# Patient Record
Sex: Female | Born: 1995 | Race: White | Hispanic: No | Marital: Single | Smoking: Never smoker
Health system: Southern US, Community
[De-identification: ages and names within clinical notes are randomized; demographics above are authoritative.]

## PROBLEM LIST (undated history)

## (undated) DIAGNOSIS — F32A Depression, unspecified: Secondary | ICD-10-CM

## (undated) DIAGNOSIS — R51 Headache: Secondary | ICD-10-CM

## (undated) DIAGNOSIS — G4700 Insomnia, unspecified: Secondary | ICD-10-CM

## (undated) DIAGNOSIS — R569 Unspecified convulsions: Secondary | ICD-10-CM

## (undated) DIAGNOSIS — F319 Bipolar disorder, unspecified: Secondary | ICD-10-CM

## (undated) DIAGNOSIS — F909 Attention-deficit hyperactivity disorder, unspecified type: Secondary | ICD-10-CM

## (undated) DIAGNOSIS — G8929 Other chronic pain: Secondary | ICD-10-CM

## (undated) DIAGNOSIS — E8881 Metabolic syndrome: Secondary | ICD-10-CM

## (undated) DIAGNOSIS — F329 Major depressive disorder, single episode, unspecified: Secondary | ICD-10-CM

## (undated) HISTORY — DX: Major depressive disorder, single episode, unspecified: F32.9

## (undated) HISTORY — PX: TONSILLECTOMY: SUR1361

## (undated) HISTORY — DX: Metabolic syndrome: E88.81

## (undated) HISTORY — PX: OTHER SURGICAL HISTORY: SHX169

## (undated) HISTORY — DX: Attention-deficit hyperactivity disorder, unspecified type: F90.9

## (undated) HISTORY — DX: Depression, unspecified: F32.A

## (undated) HISTORY — DX: Metabolic syndrome: E88.810

---
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
## 2013-01-23 ENCOUNTER — Other Ambulatory Visit: Payer: Self-pay

## 2013-01-23 DIAGNOSIS — G4452 New daily persistent headache (NDPH): Secondary | ICD-10-CM

## 2013-01-23 DIAGNOSIS — G43101 Migraine with aura, not intractable, with status migrainosus: Secondary | ICD-10-CM

## 2013-01-23 MED ORDER — RELPAX 40 MG PO TABS: ORAL_TABLET | ORAL | Status: AC

## 2013-01-23 MED ORDER — ELETRIPTAN HYDROBROMIDE 40 MG PO TABS: ORAL_TABLET | ORAL | Status: DC

## 2013-01-23 MED ORDER — TIZANIDINE HCL 4 MG PO TABS: ORAL_TABLET | ORAL | Status: AC

## 2013-01-23 MED ORDER — TOPIRAMATE 25 MG PO CPSP: ORAL_CAPSULE | ORAL | Status: AC

## 2013-01-30 ENCOUNTER — Other Ambulatory Visit: Payer: Self-pay

## 2013-01-30 DIAGNOSIS — G4452 New daily persistent headache (NDPH): Secondary | ICD-10-CM

## 2013-01-30 DIAGNOSIS — G43101 Migraine with aura, not intractable, with status migrainosus: Secondary | ICD-10-CM

## 2014-01-23 IMAGING — CR DG FOOT 2V*L*
2 series · 2 of 2 positions shown · non-contrast
Comparison: None.

CLINICAL DATA: Puncture wound

LEFT FOOT - 2 VIEW

[t foot ap left]
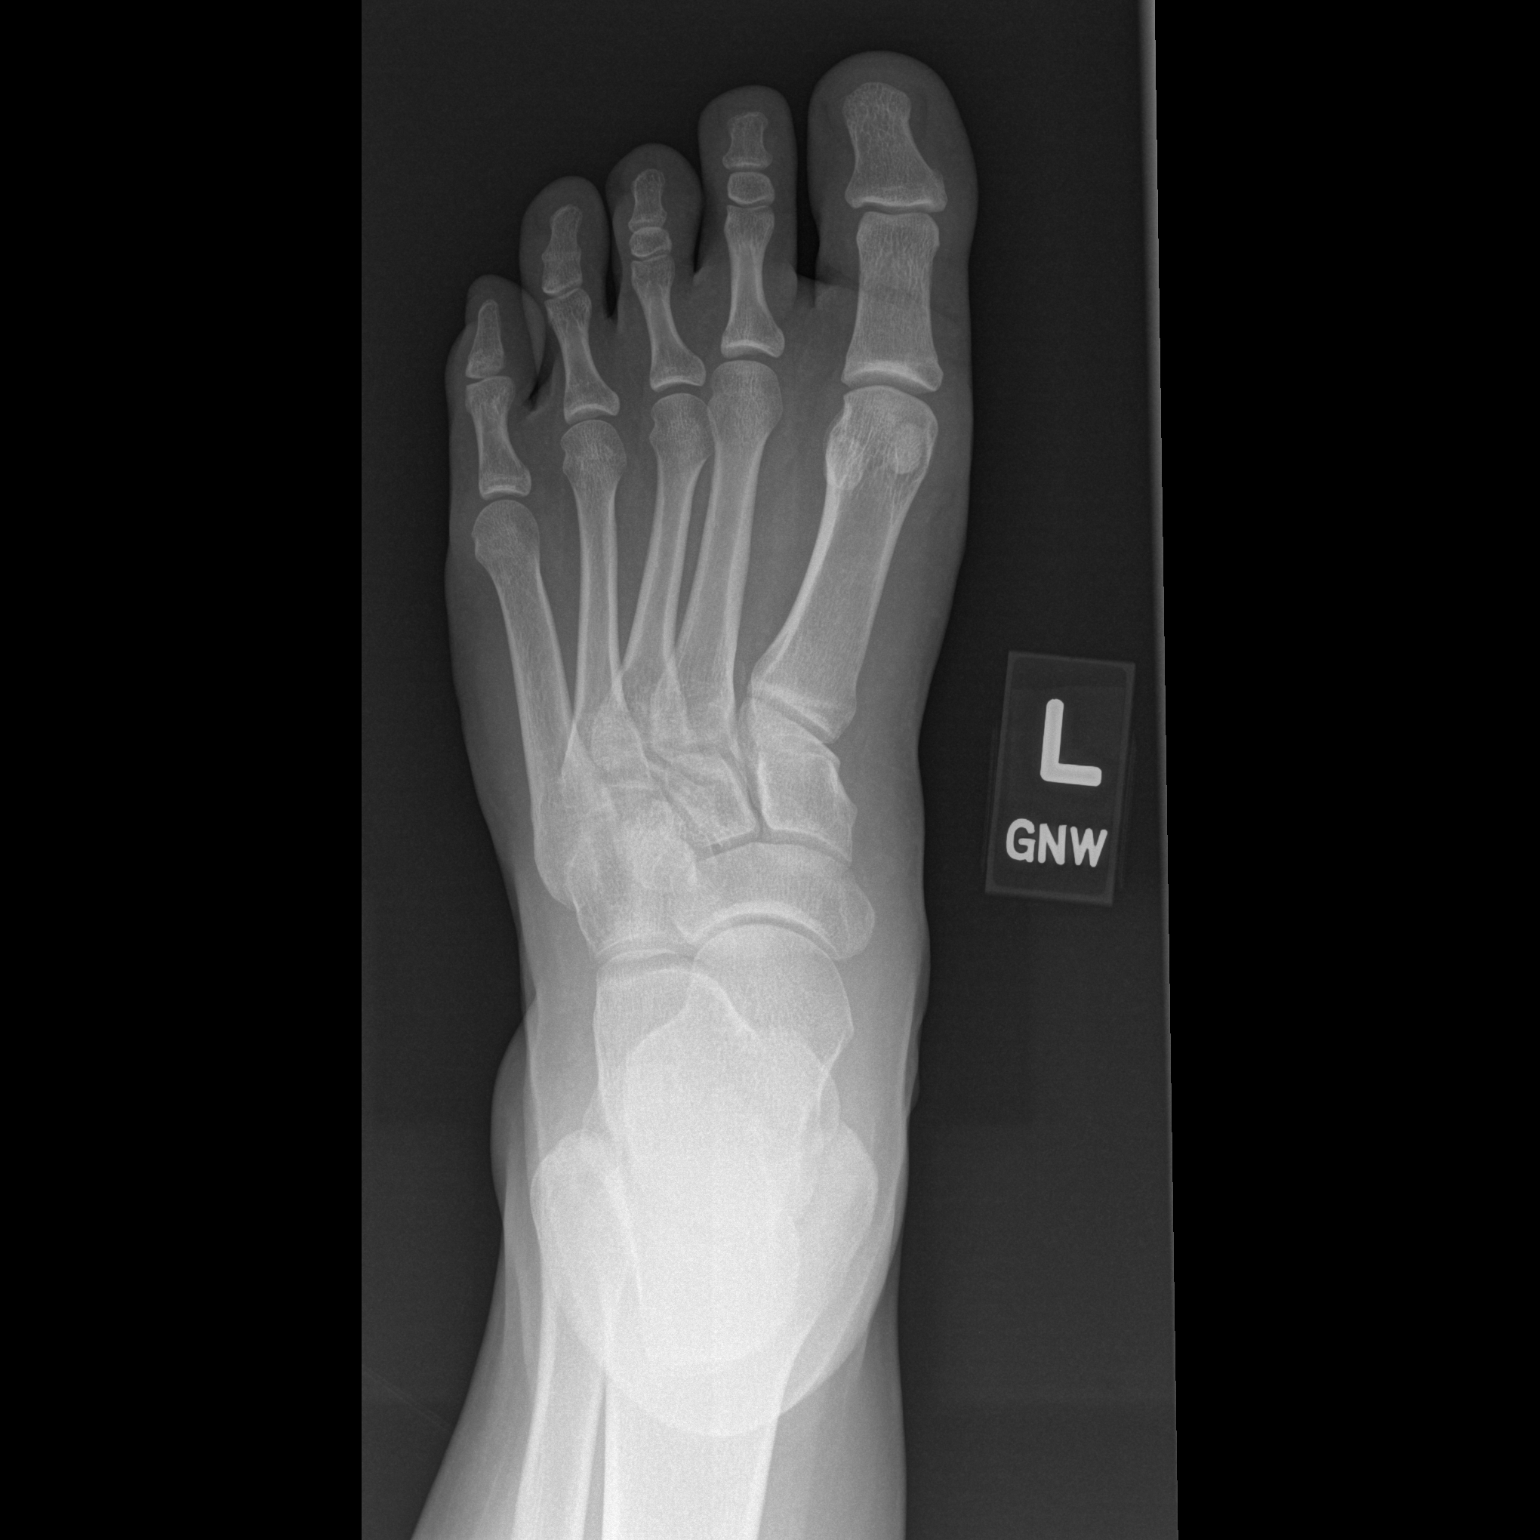

[t foot lat left]
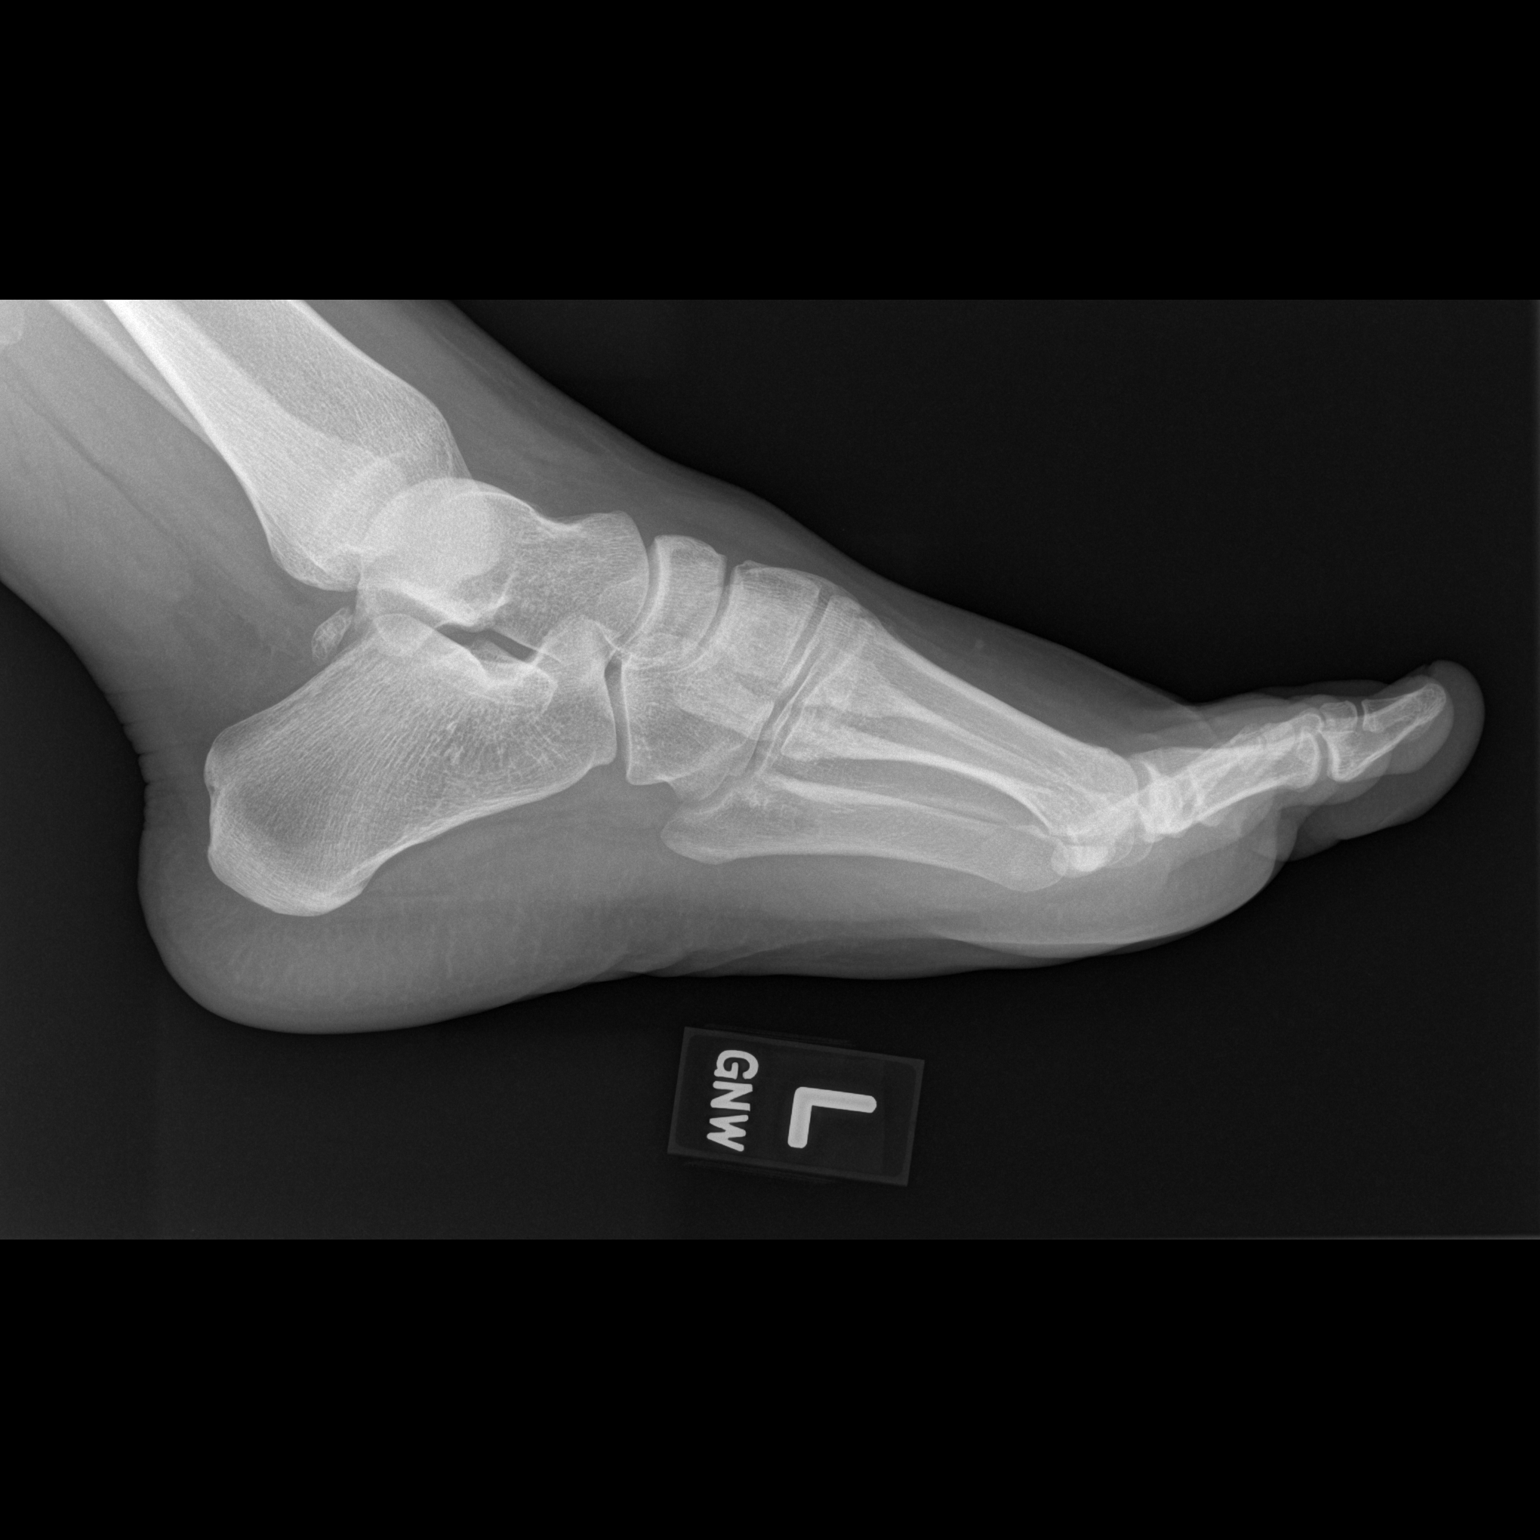

[2 of 2 positions shown; findings below may reference images not displayed]

FINDINGS: No evidence of fracture in the mid foot or forefoot..  No
radiodense foreign body present.
IMPRESSION: No fracture or foreign body.

## 2016-06-25 ENCOUNTER — Emergency Department (HOSPITAL_COMMUNITY)
Admission: EM | Admit: 2016-06-25 | Discharge: 2016-06-25 | Disposition: A | Discharge status: Home / Self Care | Payer: BLUE CROSS/BLUE SHIELD | Attending: Emergency Medicine | Admitting: Emergency Medicine

## 2016-06-25 ENCOUNTER — Encounter (HOSPITAL_COMMUNITY): Payer: Self-pay | Admitting: *Deleted

## 2016-06-25 DIAGNOSIS — Z91010 Allergy to peanuts: Secondary | ICD-10-CM | POA: Insufficient documentation

## 2016-06-25 DIAGNOSIS — F909 Attention-deficit hyperactivity disorder, unspecified type: Secondary | ICD-10-CM | POA: Diagnosis not present

## 2016-06-25 DIAGNOSIS — N390 Urinary tract infection, site not specified: Secondary | ICD-10-CM | POA: Insufficient documentation

## 2016-06-25 DIAGNOSIS — R1030 Lower abdominal pain, unspecified: Secondary | ICD-10-CM | POA: Diagnosis present

## 2016-06-25 LAB — URINE MICROSCOPIC-ADD ON

## 2016-06-25 LAB — CBC
HCT: 38.4 % (ref 36.0–46.0)
HEMOGLOBIN: 12.2 g/dL (ref 12.0–15.0)
MCH: 26.8 pg (ref 26.0–34.0)
MCHC: 31.8 g/dL (ref 30.0–36.0)
MCV: 84.4 fL (ref 78.0–100.0)
Platelets: 341 10*3/uL (ref 150–400)
RBC: 4.55 MIL/uL (ref 3.87–5.11)
RDW: 13.6 % (ref 11.5–15.5)
WBC: 12.8 10*3/uL — AB (ref 4.0–10.5)

## 2016-06-25 LAB — COMPREHENSIVE METABOLIC PANEL
ALK PHOS: 54 U/L (ref 38–126)
ALT: 14 U/L (ref 14–54)
AST: 16 U/L (ref 15–41)
Albumin: 3.4 g/dL — ABNORMAL LOW (ref 3.5–5.0)
Anion gap: 11 (ref 5–15)
BUN: 9 mg/dL (ref 6–20)
CALCIUM: 9 mg/dL (ref 8.9–10.3)
CHLORIDE: 105 mmol/L (ref 101–111)
CO2: 21 mmol/L — AB (ref 22–32)
CREATININE: 0.65 mg/dL (ref 0.44–1.00)
GFR calc non Af Amer: >60 mL/min (ref >60)
GLUCOSE: 109 mg/dL — AB (ref 65–99)
Potassium: 3.7 mmol/L (ref 3.5–5.1)
SODIUM: 137 mmol/L (ref 135–145)
Total Bilirubin: 0.5 mg/dL (ref 0.3–1.2)
Total Protein: 6.7 g/dL (ref 6.5–8.1)

## 2016-06-25 LAB — URINALYSIS, ROUTINE W REFLEX MICROSCOPIC
Bilirubin Urine: NEGATIVE
Glucose, UA: NEGATIVE mg/dL
Ketones, ur: NEGATIVE mg/dL
NITRITE: POSITIVE — AB
Protein, ur: 100 mg/dL — AB
SPECIFIC GRAVITY, URINE: 1.025 (ref 1.005–1.030)
pH: 6 (ref 5.0–8.0)

## 2016-06-25 LAB — LIPASE, BLOOD: Lipase: 23 U/L (ref 11–51)

## 2016-06-25 LAB — I-STAT BETA HCG BLOOD, ED (MC, WL, AP ONLY)

## 2016-06-25 MED ORDER — PHENAZOPYRIDINE HCL 200 MG PO TABS: 200.0000 mg | ORAL_TABLET | Freq: Three times a day (TID) | ORAL | 0 refills | Status: AC | PRN

## 2016-06-25 MED ORDER — FOSFOMYCIN TROMETHAMINE 3 G PO PACK
3.0000 g | PACK | Freq: Once | ORAL | Status: AC
  Administered 2016-06-25: 3 g via ORAL
  Filled 2016-06-25: qty 3

## 2016-06-25 MED ORDER — NAPROXEN 375 MG PO TABS: 375.0000 mg | ORAL_TABLET | Freq: Two times a day (BID) | ORAL | 0 refills | Status: AC

## 2016-06-25 NOTE — ED Triage Notes (Signed)
Pt reports lower abd and back pain for 2 days, has urinary frequency and nausea. Denies any pain with urination.

## 2016-06-25 NOTE — Discharge Instructions (Signed)
Please seek immediate care if you develop the following: ?There is back pain.  ?Your symptoms are no better or worse in 3 days. ?There is severe back pain or lower abdominal pain.  ?You develop chills.  ?You have a fever.  ?There is nausea or vomiting.  ?There is continued burning or discomfort with urination.  ? ?

## 2016-06-25 NOTE — ED Notes (Signed)
The pt has had abd pain and urinary symptoms since Wednesday   No distress

## 2016-06-26 NOTE — ED Provider Notes (Signed)
[facility]   CSN: [id number] Arrival date & time: 06/25/16  1514     History   Chief Complaint Chief Complaint  Patient presents with  . Abdominal Pain  . Back Pain    HPI   Christina Powers is a 20 y.o. female who complains of urinary frequency, urgency and dysuria x 2 days, without flank pain, fever, chills, or abnormal vaginal discharge or bleeding. She does have low back pain and suprapubic pain.  Denies DOE, SOB, chest tightness or pressure, radiation to left arm, jaw or back, or diaphoresis.  Denies headaches, light headedness, weakness, visual disturbances. Denies abdominal pain, nausea, vomiting, diarrhea or constipation.   HPI  Past Medical History:  Diagnosis Date  . ADHD (attention deficit hyperactivity disorder)   . Bipolar 1 disorder (HCC)   . Chronic headaches   . Depression   . Headache(784.0)   . Insomnia many years duration   the patient is unable to fall sleep at night time  . Metabolic syndrome   . Morbid obesity (HCC)   . Seizures (HCC) simple febrile seizure as a child    Patient Active Problem List   Diagnosis Date Noted  . 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 PCO (polycystic ovaries) 12/27/2011  . Migraine with status migrainosus 09/03/2011    Class: Acute  . Insomnia 09/03/2011    Class: Chronic  . Morbid obesity (HCC) 09/03/2011    Class: Chronic  . SORE THROAT 03/04/2009  . Overweight(278.02) 02/25/2009  . BIPOLAR DISORDER UNSPECIFIED 02/25/2009  . UNSPEC POLYARTHROPATHY/POLYARTHRIT MX SITES 02/25/2009  . SPASM, MUSCLE 02/25/2009  . DIZZINESS 02/25/2009  . COUGH 02/25/2009  . FEVER, HX OF 02/25/2009    Past Surgical History:  Procedure Laterality Date  . Arthroscopic surgery on her knee for a torn anterior cruciate ligament    . TONSILLECTOMY  plus adenoidectomy   2000    OB History    Gravida Para Term Preterm AB Living             0   SAB TAB Ectopic Multiple Live Births                    Home Medications    Prior to Admission medications   Medication Sig Start Date End Date Taking? Authorizing Provider  acetaminophen (TYLENOL) 325 MG tablet Take 650 mg by mouth every 6 (six) hours as needed. For headaches   Yes Historical Provider, MD  ibuprofen (ADVIL,MOTRIN) 200 MG tablet Take 800 mg by mouth every 6 (six) hours as needed. For headache   Yes Historical Provider, MD  Norgestimate-Ethinyl Estradiol Triphasic (TRINESSA, 28,) 0.18/0.215/0.25 MG-35 MCG tablet Take 1 tablet by mouth daily. 05/20/15  Yes Historical Provider, MD  medroxyPROGESTERone (PROVERA) 10 MG tablet Take 1 tablet (10 mg total) by mouth daily. Patient not taking: Reported on 06/25/2016 11/13/12   Osborn Coho, MD  naproxen (NAPROSYN) 375 MG tablet Take 1 tablet (375 mg total) by mouth 2 (two) times daily. 06/25/16   Arthor Captain, PA-C  phenazopyridine (PYRIDIUM) 200 MG tablet Take 1 tablet (200 mg total) by mouth 3 (three) times daily as needed for pain. 06/25/16   Guy Seese, PA-C  RELPAX 40 MG tablet One tablet by mouth at onset of headache. May repeat in 2 hours if headache persists or recurs. 1 tab by mouth at onset of migraine with 400 mg ibuprofen Patient not taking: Reported on 06/25/2016 01/23/13   Deetta Perla, MD  tiZANidine (ZANAFLEX) 4  MG tablet Take 1 tab by mouth when necessary for severe headache. Not to exceed 2 tabs a day. Patient not taking: Reported on 06/25/2016 01/23/13   Deetta PerlaWilliam H Hickling, MD  topiramate (TOPAMAX) 25 MG capsule Take 5 tabs by mouth at bedtime Patient not taking: Reported on 06/25/2016 01/23/13   Deetta PerlaWilliam H Hickling, MD    Family History Family History  Problem Relation Age of Onset  . Cancer Maternal Grandmother   . Diabetes Maternal Grandmother   . Cancer Maternal Grandfather   . Diabetes Maternal Grandfather   . Early death Paternal Grandmother   . Heart disease Paternal Grandmother   . Early death Paternal Grandfather   . Heart disease Paternal  Grandfather   . Migraines Other   . Migraines Mother   . Migraines Maternal Aunt   . Migraines Maternal Uncle   . Migraines Cousin   . Stroke Cousin     Parent  . Hypertension Cousin     Parent    Social History Social History  Substance Use Topics  . Smoking status: Never Smoker  . Smokeless tobacco: Never Used  . Alcohol use No     Allergies   Peanut-containing drug products   Review of Systems Review of Systems  Ten systems reviewed and are negative for acute change, except as noted in the HPI.   Physical Exam Updated Vital Signs BP (!) 154/103   Pulse 74   Temp 98.4 F (36.9 C) (Oral)   Resp 14   LMP 06/11/2016   SpO2 96%   Physical Exam  Constitutional: She is oriented to person, place, and time. She appears well-developed and well-nourished. No distress.  HENT:  Head: Normocephalic and atraumatic.  Eyes: Conjunctivae are normal. No scleral icterus.  Neck: Normal range of motion.  Cardiovascular: Normal rate, regular rhythm and normal heart sounds.  Exam reveals no gallop and no friction rub.   No murmur heard. Pulmonary/Chest: Effort normal and breath sounds normal. No respiratory distress.  Abdominal: Soft. Bowel sounds are normal. She exhibits no distension and no mass. There is no tenderness. There is no guarding.  No suprapubic tenderness or CVA tenderness. BL lumbar paraspinal tenderness  Neurological: She is alert and oriented to person, place, and time.  Skin: Skin is warm and dry. She is not diaphoretic.  Nursing note and vitals reviewed.    ED Treatments / Results  Labs (all labs ordered are listed, but only abnormal results are displayed) Labs Reviewed  COMPREHENSIVE METABOLIC PANEL - Abnormal; Notable for the following:       Result Value   CO2 21 (*)    Glucose, Bld 109 (*)    Albumin 3.4 (*)    All other components within normal limits  CBC - Abnormal; Notable for the following:    WBC 12.8 (*)    All other components within  normal limits  URINALYSIS, ROUTINE W REFLEX MICROSCOPIC (NOT [facility]) - Abnormal; Notable for the following:    APPearance TURBID (*)    Hgb urine dipstick LARGE (*)    Protein, ur 100 (*)    Nitrite POSITIVE (*)    Leukocytes, UA MODERATE (*)    All other components within normal limits  URINE MICROSCOPIC-ADD ON - Abnormal; Notable for the following:    Squamous Epithelial / LPF 6-30 (*)    Bacteria, UA MANY (*)    All other components within normal limits  LIPASE, BLOOD  I-STAT BETA HCG BLOOD, ED ([facility], [facility], [facility] ONLY)  EKG  EKG Interpretation None       Radiology No results found.  Procedures Procedures (including critical care time)  Medications Ordered in ED Medications  fosfomycin (MONUROL) packet 3 g (3 g Oral Given 06/25/16 1856)     Initial Impression / Assessment and Plan / ED Course  I have reviewed the triage vital signs and the nursing notes.  Pertinent labs & imaging results that were available during my care of the patient were reviewed by me and considered in my medical decision making (see chart for details).  Clinical Course    Pt has been diagnosed with a UTI. Pt is afebrile, no CVA tenderness, normotensive, and denies N/V. Treated with Fosfomycin.Pt to be dc home with pyridiumand instructions to follow up with PCP if symptoms persist and for hypertension.   Final Clinical Impressions(s) / ED Diagnoses   Final diagnoses:  UTI (lower urinary tract infection)    New Prescriptions Discharge Medication List as of 06/25/2016  6:13 PM    START taking these medications   Details  naproxen (NAPROSYN) 375 MG tablet Take 1 tablet (375 mg total) by mouth 2 (two) times daily., Starting Sun 06/25/2016, Print    phenazopyridine (PYRIDIUM) 200 MG tablet Take 1 tablet (200 mg total) by mouth 3 (three) times daily as needed for pain., Starting Sun 06/25/2016, Print         Arthor Captain, PA-C 06/26/16 1610    Lorre Nick, MD 06/27/16 [phone]

## 2021-08-12 ENCOUNTER — Telehealth: Payer: Self-pay

## 2021-08-12 NOTE — Telephone Encounter (Signed)
Attempted to reach patient to offer 2nd MPX vaccine. Phone number listed in epic is not current phone number. Valarie Cones
